# Patient Record
Sex: Male | Born: 1950 | Race: White | Hispanic: No | Marital: Married
Health system: Midwestern US, Community
[De-identification: ages and names within clinical notes are randomized; demographics above are authoritative.]

## PROBLEM LIST (undated history)

## (undated) DIAGNOSIS — R972 Elevated prostate specific antigen [PSA]: Secondary | ICD-10-CM

## (undated) DIAGNOSIS — Z8547 Personal history of malignant neoplasm of testis: Secondary | ICD-10-CM

---

## 2013-05-22 LAB — PSA, DIAGNOSTIC (PROSTATE SPECIFIC AG): PSA Total: 4.1

## 2013-05-26 NOTE — Telephone Encounter (Signed)
Called Mr.Steven to schedule him for a appointment with Dr.Brandon on Wednesday @ 10:30am however, he wasn't available. Left my name,number and extension to call our office back to schedule the appointment based on the office notes his (PCP) Dr. Dellia BeckwithPearman faxed over.

## 2013-06-05 LAB — AMB POC URINALYSIS DIP STICK AUTO W/O MICRO
Bilirubin (UA POC): NEGATIVE
Blood (UA POC): NEGATIVE
Glucose (UA POC): NEGATIVE
Ketones (UA POC): NEGATIVE
Leukocyte esterase (UA POC): NEGATIVE
Nitrites (UA POC): NEGATIVE
Protein (UA POC): NEGATIVE mg/dL
Specific gravity (UA POC): 1.02 (ref 1.001–1.035)
Urobilinogen (UA POC): 0.2 (ref 0.2–1)
pH (UA POC): 7.5 (ref 4.6–8.0)

## 2013-06-05 NOTE — Progress Notes (Signed)
ASSESSMENT:     ICD-9-CM    1. Chronic prostatitis 601.1 AMB POC URINALYSIS DIP STICK AUTO W/O MICRO   2. History of testicular cancer V10.47    3. Elevated PSA 790.93    4. 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 Elevated PSA- monitor/ recheck in 2-3 month  2. H/o gonadal mixed germ cell tumor- NED, needs CXR and AFP  3. ED- consider Cialis daily for BPH as DRE show mod enlargement  4. Hypogonadism- will refrain from testosterone replacement until full PSA evaluation       PLAN:  Schedule CXR, AFP, LDH, PSA in 2-3 months  Follow up after labs and CXR  Consider daily cialis vs testosterone replacement of PSA returned to baseline                 DISCUSSION: Elevate PSA discussion:    We reviewed the implications of an elevated PSA and the uncertainty surrounding it. In general, a man's PSA increases with age.   An elevated PSA can be a manifestation of one or more of the following items: prostate enlargement, prostate infection, or prostate cancer. Management of an elevated PSA can include observation or prostate biopsy and we discussed this in detail. All of his questions were answered to his level of satisfaction.       We reviewed the procedure for a prostate biopsy in detail.  I recommend rechecking his PSA in 3 months and if still elevated pursue biopsy.    Chief Complaint   Patient presents with   ??? Testicular Cancer   ??? Elevated PSA     4.7 on 05/19/13   ??? Erectile Dysfunction       HISTORY OF PRESENT ILLNESS:  Edward Beltran is a 63 y.o. male who presents today for consultation and has been referred by Dr. Alda LeaSTEVEN D PEARMAN, MD  .  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  He reports positive AFP prior to surgery.  He underwent RPLND without of evidence of reccurence since that surgery.     His psa rose from 1 to 2.8 in 2014 to most recent 4.7 with repeat value of 4.1 (3 days later).    No FH of prostate cancer.      He reports no voiding symptoms rare  1 x nocturia.    His testosterone was low 248    DRE normal today         Review of Systems  Constitutional: Fever: No;  Skin: Rash: No;  HEENT: Hearing difficulty: No;  Eyes: Blurred vision: No;  Cardiovascular: Chest pain: No;  Respiratory: Shortness of breath: No;  Gastrointestinal: Nausea/vomiting: No;  Musculoskeletal: Back pain: No;  Neurological: Weakness: No;  Psychological: Memory loss: No;  Comments/additional findings:                 Past Medical History   Diagnosis Date   ??? Kidney stones    ??? Testicular cancer (HCC)    ??? Essential tremor        Past Surgical History   Procedure Laterality Date   ??? Hx hernia repair  1982, 2013   ??? Hx urological  1985     Right testical removal   ??? Hx other surgical  1986     Lymphnode disection       History   Substance Use Topics   ??? Smoking status: Never Smoker    ???  Smokeless tobacco: Never Used   ??? Alcohol Use: Yes      Comment: OCC       No Known Allergies    Family History   Problem Relation Age of Onset   ??? Heart Disease Father    ??? Cancer Brother      Brain cancer/tumor   ??? Alzheimer Mother        Current Outpatient Prescriptions   Medication Sig Dispense Refill   ??? simvastatin (ZOCOR) 40 mg tablet        ??? propranolol LA (INDERAL LA) 120 mg SR capsule        ??? Aspirin, Buffered 81 mg tab Take  by mouth.             PHYSICAL EXAMINATION:   BP 130/78   Ht 6' (1.829 m)   Wt 210 lb (95.255 kg)   BMI 28.47 kg/m2  Constitutional: WDWN, Pleasant and appropriate affect, No acute distress.    CV:  No peripheral swelling noted  Respiratory: No respiratory distress or difficulties  Abdomen:  No abdominal masses or tenderness. No CVA tenderness. No inguinal hernias noted.   GU Male:    RUE:[id number]:Perineum normal to visual inspection, no erythema or irritation, Sphincter with good tone, Rectum with no hemorrhoids, fissures or masses, Prostate smooth, symmetric and anodular. Prostate is moderate.  SCROTUM:  No scrotal rash or lesions noticed.  Normal bilateral testes and epididymis.    PENIS: Urethral meatus normal in location and size. No urethral discharge.  Skin: No jaundice.    Neuro/Psych:  Alert and oriented x 3, affect appropriate.   Lymphatic:   No enlarged inguinal lymph nodes.        REVIEW OF LABS AND IMAGING:      Results for orders placed in visit on 06/05/13   AMB POC URINALYSIS DIP STICK AUTO W/O MICRO       Result Value Ref Range    Color (UA POC) Yellow      Clarity (UA POC) Clear      Glucose (UA POC) Negative  Negative    Bilirubin (UA POC) Negative  Negative    Ketones (UA POC) Negative  Negative    Specific gravity (UA POC) 1.020  1.001 - 1.035    Blood (UA POC) Negative  Negative    pH (UA POC) 7.5  4.6 - 8.0    Protein (UA POC) Negative  Negative mg/dL    Urobilinogen (UA POC) 0.2 mg/dL  0.2 - 1    Nitrites (UA POC) Negative  Negative    Leukocyte esterase (UA POC) Negative  Negative         Imaging Report Reviewed?  NO   Type:    Images Reviewed?      NO      Type:       Other Lab Data Reviewed?   YES    CBC, PSA and tests      I reviewed his CT scan from prior to his incisional hernia surgery.    A copy of today's office visit with all pertinent imaging results and labs were sent to the referring physician, Dr. Alda LeaSTEVEN D PEARMAN, MD         Francella SolianFrances M Buffey Zabinski, MD on 06/05/2013

## 2013-08-05 ENCOUNTER — Encounter

## 2013-08-17 LAB — PSA, DIAGNOSTIC (PROSTATE SPECIFIC AG): Prostate Specific Ag: 3.2 ng/mL (ref 0.0–4.0)

## 2013-08-17 LAB — AFP, TUMOR MARKER
AFP, SERUM, TUMOR MARKER, 002266: 1.1 ng/mL (ref 0.0–8.3)
AFP, Serum, Tumor Marker: 1.1 ng/mL (ref 0.0–8.3)

## 2013-08-17 LAB — LD: LD: 171 IU/L (ref 121–224)

## 2013-08-21 LAB — PROSTATE SPECIFIC ANTIGEN, TOTAL (PSA): PSA Total: 4.1

## 2013-08-24 LAB — AMB POC URINALYSIS DIP STICK AUTO W/O MICRO
Bilirubin (UA POC): NEGATIVE
Blood (UA POC): NEGATIVE
Glucose (UA POC): NEGATIVE
Ketones (UA POC): NEGATIVE
Leukocyte esterase (UA POC): NEGATIVE
Nitrites (UA POC): NEGATIVE
Specific gravity (UA POC): 1.02 (ref 1.001–1.035)
Urobilinogen (UA POC): 0.2 (ref 0.2–1)
pH (UA POC): 7 (ref 4.6–8.0)

## 2013-08-24 MED ORDER — TADALAFIL 5 MG TABLET
5 mg | ORAL_TABLET | Freq: Every day | ORAL | Status: DC
Start: 2013-08-24 — End: 2014-12-02

## 2013-08-24 NOTE — Progress Notes (Signed)
ASSESSMENT:    1. Elevated PSA- recheck PSA in 6 months  2. H/o gonadal mixed germ cell tumor- NED,   3.BPH- consider Cialis daily for BPH as DRE show mod enlargement  4. Hypogonadism- will refrain from testosterone replacement until full PSA evaluation  5. Kidney stones- increase hydration       PLAN:  CXR WNL  CT stone protocol reviewed with left distal ureteral calculus and multiple renal calcifications  Consider daily cialis vs testosterone replacement of PSA returned to baseline  PSA is 3.2 today.  Will repeat PSA in 6 months  Follow up in 6 months.  Consider 24hr urinalysis for stones.         DISCUSSION: Elevate PSA discussion:    We reviewed the implications of an elevated PSA and the uncertainty surrounding it. In general, a man's PSA increases with age.   An elevated PSA can be a manifestation of one or more of the following items: prostate enlargement, prostate infection, or prostate cancer. Management of an elevated PSA can include observation or prostate biopsy and we discussed this in detail. All of his questions were answered to his level of satisfaction.       We reviewed the procedure for a prostate biopsy in detail.  I recommend rechecking his PSA in 3 months and if still elevated pursue biopsy.    Chief Complaint   Patient presents with   ??? Prostatitis     Pt has no c/o.    ??? Testicular Cancer   ??? Elevated PSA       HISTORY OF PRESENT ILLNESS:  Edward Beltran is a 63 y.o. male who presents today for consultation and has been referred by Dr. Alda LeaSTEVEN D PEARMAN, MD (General)     .  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  He reports positive AFP prior to surgery.  He underwent RPLND without of evidence of reccurence since that surgery.  His tumor markers are negative today.  I reviewed his CT scan- althoug it was noncontrasted there was no evidence of adenopathy or recurrent in retroperitneum.     His psa rose from 1 to 2.8 in 2014 to most recent 4.7 with repeat value of 4.1 (3 days later).  PSA is pending.    No FH of prostate cancer.      He reports no voiding symptoms rare 1 x nocturia.    His testosterone was low 248    DRE normal today         Review of Systems  Constitutional: Fever: No;  Skin: Rash: No;  HEENT: Hearing difficulty: No;  Eyes: Blurred vision: No;  Cardiovascular: Chest pain: No;  Respiratory: Shortness of breath: No;  Gastrointestinal: Nausea/vomiting: No;  Musculoskeletal: Back pain: No;  Neurological: Weakness: No;  Psychological: Memory loss: No;  Comments/additional findings:                 Past Medical History   Diagnosis Date   ??? Kidney stones    ??? Testicular cancer (HCC)    ??? Essential tremor        Past Surgical History   Procedure Laterality Date   ??? Hx hernia repair  1982, 2013   ??? Hx urological  1985     Right testical removal   ??? Hx other surgical  1986     Lymphnode disection       History   Substance Use Topics   ??? Smoking status:  Never Smoker    ??? Smokeless tobacco: Never Used   ??? Alcohol Use: Yes      Comment: OCC       No Known Allergies    Family History   Problem Relation Age of Onset   ??? Heart Disease Father    ??? Cancer Brother      Brain cancer/tumor   ??? Alzheimer Mother        Current Outpatient Prescriptions   Medication Sig Dispense Refill   ??? tadalafil (CIALIS) 5 mg tablet Take 5 mg by mouth daily. For BPH 30 Tab 11   ??? simvastatin (ZOCOR) 40 mg tablet      ??? propranolol LA (INDERAL LA) 120 mg SR capsule      ??? Aspirin, Buffered 81 mg tab Take  by mouth.           PHYSICAL EXAMINATION:   BP 130/80 mmHg   Ht 6' (1.829 m)   Wt 210 lb (95.255 kg)   BMI 28.47 kg/m2  Constitutional: WDWN, Pleasant and appropriate affect, No acute distress.    CV:  No peripheral swelling noted  Respiratory: No respiratory distress or difficulties  Abdomen:  No abdominal masses or tenderness. No CVA tenderness. No inguinal hernias noted.    GU Male:  No inguinal hernia, normal phallus, DRE deferrred today- will need on next visit.  Skin: No jaundice.    Neuro/Psych:  Alert and oriented x 3, affect appropriate.   Lymphatic:   No enlarged inguinal lymph nodes.        REVIEW OF LABS AND IMAGING:      PSA 3.2    Results for orders placed or performed in visit on 08/24/13   AMB POC URINALYSIS DIP STICK AUTO W/O MICRO   Result Value Ref Range    Color (UA POC) Yellow     Clarity (UA POC) Clear     Glucose (UA POC) Negative Negative    Bilirubin (UA POC) Negative Negative    Ketones (UA POC) Negative Negative    Specific gravity (UA POC) 1.020 1.001 - 1.035    Blood (UA POC) Negative Negative    pH (UA POC) 7.0 4.6 - 8.0    Protein (UA POC) Trace Negative mg/dL    Urobilinogen (UA POC) 0.2 mg/dL 0.2 - 1    Nitrites (UA POC) Negative Negative    Leukocyte esterase (UA POC) Negative Negative         Imaging Report Reviewed? YES   Type:   CXR and CT scan  Images Reviewed?      Yes      Type:       Other Lab Data Reviewed?   YES    CBC, PSA, AFP and LDH, PSA WNL    A copy of today's office visit with all pertinent imaging results and labs were sent to the referring physician, Dr. Alda Lea, MD (General)         Francella Solian, MD on 08/24/2013

## 2013-10-08 NOTE — Telephone Encounter (Signed)
Called pt insurance @ [phone] and received a prior auth on pt's Cialis  Rx from [place], [place] # [id number]. Brittney S. informed pt via MyChart.

## 2014-02-19 ENCOUNTER — Encounter: Attending: Urology

## 2014-02-19 ENCOUNTER — Ambulatory Visit: Admit: 2014-02-19 | Discharge: 2014-02-19 | Payer: PRIVATE HEALTH INSURANCE | Attending: Urology

## 2014-02-19 DIAGNOSIS — R972 Elevated prostate specific antigen [PSA]: Secondary | ICD-10-CM

## 2014-02-19 LAB — AMB POC URINALYSIS DIP STICK AUTO W/O MICRO
Bilirubin (UA POC): NEGATIVE
Blood (UA POC): NEGATIVE
Glucose (UA POC): NEGATIVE
Ketones (UA POC): NEGATIVE
Leukocyte esterase (UA POC): NEGATIVE
Nitrites (UA POC): NEGATIVE
Protein (UA POC): NEGATIVE mg/dL
Specific gravity (UA POC): 1.02 (ref 1.001–1.035)
Urobilinogen (UA POC): 1 (ref 0.2–1)
pH (UA POC): 5.5 (ref 4.6–8.0)

## 2014-02-19 NOTE — Progress Notes (Signed)
ASSESSMENT:     ICD-10-CM ICD-9-CM    1. Elevated PSA R97.2 790.93 AMB POC URINALYSIS DIP STICK AUTO W/O MICRO   2. Kidney stone N20.0 592.0 AMB POC URINALYSIS DIP STICK AUTO W/O MICRO   3. Mixed germ cell tumor (HCC) C80.1 199.1      1. Patient is a 64 y.o. Caucasian male with stable PSA of 2.8ng/mL on 11/24/13.   2. H/o gonadal mixed germ cell tumor- NED  3. BPH- Cialis 5mg  daily for BPH   4. Hypogonadism- refrain from Testosterone replacement as patient does not feel he needs it right noe  5. Kidney stones- increase hydration      PLAN:    Patient is satisfied with his energy levels  Continue Cialis 5mg  daily for BPH  PSA 2.8ng/mL on 11/24/13  PSA, T rx given  F/u in 6 months with PSA and T prior      DISCUSSION: Elevate PSA discussion:    We reviewed the implications of an elevated PSA and the uncertainty surrounding it. In general, a man's PSA increases with age.   An elevated PSA can be a manifestation of one or more of the following items: prostate enlargement, prostate infection, or prostate cancer. Management of an elevated PSA can include observation or prostate biopsy and we discussed this in detail. All of his questions were answered to his level of satisfaction.       We reviewed the procedure for a prostate biopsy in detail.  I recommend rechecking his PSA in 3 months and if still elevated pursue biopsy.    Chief Complaint   Patient presents with   ??? Elevated PSA   ??? Kidney Stone   ??? prostatitis   ??? Testicular Cancer       HISTORY OF PRESENT ILLNESS:  Edward Beltran is a 64 y.o. Caucasian male who presents today in follow up for an elevated PSA.   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 Per patient,  positive AFP prior to surgery.  He underwent RPLND without of evidence of reccurence since that surgery. Tumor markers were negative at last visit.   Patient had a CT scan, althoug it was noncontrasted there was no evidence  of adenopathy or recurrent in retroperitneum.    Patient denies a FH of prostate cancer.    Patients PSA rose from 1ng/mL to 2.8ng/mL in 2014 to most recent 2.8ng/mL on 11/24/13.  Testosterone 246ng/dL on 4/69/624/10/15     Patient denies major urinary complaints.    Reports occasional nocturia x1-2.   He specifically denies gross hematuria, dysuria, starting/stopping stream, sense of PVR.     Patient is currently on Cialis 5mg  nightly. He reports he sometimes forgets to take his medications. He denies SEs.  Patient is taking baby ASA, Vitamins, Inderal and Simvastatin     Patient reports his overall energy level is fine. He denies fatigue.   Patient reports working out Monday - Thursday. He reports he does not sleep well when he does not work out.   Patient would like to hold off on testosterone replacement.   Patient does squat thrusts and he works out on the elliptical      PSA /TESTOSTERONE - BSHSI PSA   Latest Ref Rng 0.0 - 4.0 ng/mL   11/24/13 2.8   08/13/2013 3.2       Review of Systems  Constitutional: Fever: No  Skin: Rash: No  HEENT: Hearing difficulty: No  Eyes: Blurred  vision: No  Cardiovascular: Chest pain: No  Respiratory: Shortness of breath: No  Gastrointestinal: Nausea/vomiting: No  Musculoskeletal: Back pain: No  Neurological: Weakness: No  Psychological: Memory loss: No  Comments/additional findings:       Past Medical History   Diagnosis Date   ??? Kidney stones    ??? Testicular cancer (HCC)    ??? Essential tremor        Past Surgical History   Procedure Laterality Date   ??? Hx hernia repair  1982, 2013   ??? Hx urological  1985     Right testical removal   ??? Hx other surgical  1986     Lymphnode disection       History   Substance Use Topics   ??? Smoking status: Never Smoker    ??? Smokeless tobacco: Never Used   ??? Alcohol Use: 0.0 oz/week     0 Not specified per week      Comment: OCC       No Known Allergies    Family History   Problem Relation Age of Onset   ??? Heart Disease Father    ??? Cancer Brother       Brain cancer/tumor   ??? Alzheimer Mother        Current Outpatient Prescriptions   Medication Sig Dispense Refill   ??? tadalafil (CIALIS) 5 mg tablet Take 5 mg by mouth daily. For BPH 30 Tab 11   ??? simvastatin (ZOCOR) 40 mg tablet      ??? propranolol LA (INDERAL LA) 120 mg SR capsule      ??? Aspirin, Buffered 81 mg tab Take  by mouth.           PHYSICAL EXAMINATION:   BP 138/84 mmHg   Ht 6' (1.829 m)   Wt 210 lb (95.255 kg)   BMI 28.47 kg/m2  Constitutional: WDWN, Pleasant and appropriate affect, No acute distress.    CV:  No peripheral swelling noted, RRR  Respiratory: No respiratory distress or difficulties, CTA B  Abdomen:  No abdominal masses or tenderness. No CVA tenderness. No inguinal hernias noted.   GU Male:  DRE referred  Skin: No jaundice.    Neuro/Psych:  Alert and oriented x 3, affect appropriate.   Lymphatic:   No enlarged inguinal lymph nodes.        REVIEW OF LABS AND IMAGING:    Results for orders placed or performed in visit on 02/19/14   AMB POC URINALYSIS DIP STICK AUTO W/O MICRO   Result Value Ref Range    Color (UA POC)      Clarity (UA POC)      Glucose (UA POC) Negative Negative    Bilirubin (UA POC) Negative Negative    Ketones (UA POC) Negative Negative    Specific gravity (UA POC) 1.020 1.001 - 1.035    Blood (UA POC) Negative Negative    pH (UA POC) 5.5 4.6 - 8.0    Protein (UA POC) Negative Negative mg/dL    Urobilinogen (UA POC) 1 mg/dL 0.2 - 1    Nitrites (UA POC) Negative Negative    Leukocyte esterase (UA POC) Negative Negative       Imaging Report Reviewed? NO   Type:     Images Reviewed?      NO      Type:       Other Lab Data Reviewed?   YES    PSA, UA    A copy of today's  office visit with all pertinent imaging results and labs were sent to the referring physician, Dr. Alda Lea, MD         Francella Solian, MD on 02/19/2014        Medical documentation is provided with the assistance of Nikki L. Bing Plume, medical scribe for Francella Solian, MD on 02/19/2014.

## 2014-08-23 ENCOUNTER — Encounter: Attending: Urology

## 2014-08-25 ENCOUNTER — Encounter

## 2014-08-30 ENCOUNTER — Encounter: Attending: Urology

## 2014-08-30 ENCOUNTER — Ambulatory Visit: Admit: 2014-08-30 | Discharge: 2014-08-30 | Payer: PRIVATE HEALTH INSURANCE | Attending: Urology

## 2014-08-30 DIAGNOSIS — R972 Elevated prostate specific antigen [PSA]: Secondary | ICD-10-CM

## 2014-08-30 LAB — AMB POC URINALYSIS DIP STICK AUTO W/O MICRO
Bilirubin (UA POC): NEGATIVE
Blood (UA POC): NEGATIVE
Glucose (UA POC): NEGATIVE
Ketones (UA POC): NEGATIVE
Leukocyte esterase (UA POC): NEGATIVE
Nitrites (UA POC): NEGATIVE
Protein (UA POC): NEGATIVE mg/dL
Specific gravity (UA POC): 1.025 (ref 1.001–1.035)
Urobilinogen (UA POC): 1 (ref 0.2–1)
pH (UA POC): 5.5 (ref 4.6–8.0)

## 2014-08-30 NOTE — Progress Notes (Signed)
ASSESSMENT:     ICD-10-CM ICD-9-CM    1. Elevated PSA R97.2 790.93 PR COLLECTION VENOUS BLOOD,VENIPUNCTURE      PROSTATE SPECIFIC AG (PSA)      AMB POC URINALYSIS DIP STICK AUTO W/O MICRO   2. 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      AMB POC URINALYSIS DIP STICK AUTO W/O MICRO   3. Erectile dysfunction, unspecified erectile dysfunction type N52.9 607.84 TESTOSTERONE, TOTAL, ADULT MALE      PR COLLECTION VENOUS BLOOD,VENIPUNCTURE      AMB POC URINALYSIS DIP STICK AUTO W/O MICRO     1. Patient is a 64 y.o. Caucasian male with stable PSA of 2.8ng/mL on 11/24/13.   2. H/o gonadal mixed germ cell tumor- NED  3. BPH- Cialis 5mg  daily for BPH   4. Hypogonadism- refrain from Testosterone replacement as patient does not feel he needs it right now  5. Kidney stones- increase hydration      PLAN:    Labs reviewed  Patient is satisfied with his energy levels  Continue Cialis 5mg  daily for BPH   PSA 2.8ng/mL on 11/24/13, today's drawn   F/u in 1 year with psa, tumor markers and T unless today's require earlier       DISCUSSION: Elevate PSA discussion:    We reviewed the implications of an elevated PSA and the uncertainty surrounding it. In general, a man's PSA increases with age.   An elevated PSA can be a manifestation of one or more of the following items: prostate enlargement, prostate infection, or prostate cancer. Management of an elevated PSA can include observation or prostate biopsy and we discussed this in detail. All of his questions were answered to his level of satisfaction.       We reviewed the procedure for a prostate biopsy in detail.  I recommend rechecking his PSA in 3 months and if still elevated pursue biopsy.    Chief Complaint   Patient presents with   ??? Testicular Cancer       HISTORY OF PRESENT ILLNESS:  Edward Beltran is a 64 y.o. Caucasian male who presents today in follow up for an elevated PSA.    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 Per patient,  positive AFP prior to surgery.  He underwent RPLND without of evidence of reccurence since that surgery. Tumor markers were negative at last visit.   Patient had a CT scan, although it was noncontrasted there was no evidence of adenopathy or recurrent in retroperitneum.    Patient denies a FH of prostate cancer.    Patients PSA rose from 1ng/mL to 2.8ng/mL in 2014 to most recent 2.8ng/mL on 11/24/2013  Testosterone 246ng/dL on 5/62/134/10/15, today's drawn in office     Patient denies major urinary complaints.    Reports occasional nocturia x1-2.   He specifically denies gross hematuria, dysuria, starting/stopping stream, sense of PVR.     Patient is currently on Cialis 5mg  nightly. He reports he sometimes forgets to take his medications. He denies SEs.  Patient is taking baby ASA, Vitamins, Inderal and Simvastatin     Patient reports his overall energy level is fine. He denies fatigue.   Patient reports working out Monday - Thursday. He reports he does not sleep well when he does not work out.  Patient would like to hold off on testosterone replacement.   Patient does squat thrusts and he works out on the elliptical      PSA /TESTOSTERONE - BSHSI PSA   Latest Ref Rng 0.0 - 4.0 ng/mL   08/30/2014 pending   11/24/13 2.8   08/13/2013 3.2       Review of Systems  Constitutional: Fever: No  Skin: Rash: No  HEENT: Hearing difficulty: No  Eyes: Blurred vision: No  Cardiovascular: Chest pain: No  Respiratory: Shortness of breath: No  Gastrointestinal: Nausea/vomiting: No  Musculoskeletal: Back pain: No  Neurological: Weakness: No  Psychological: Memory loss: No  Comments/additional findings:       Past Medical History   Diagnosis Date   ??? Kidney stones    ??? Testicular cancer (HCC)    ??? Essential tremor        Past Surgical History   Procedure Laterality Date   ??? Hx hernia repair  1982, 2013   ??? Hx urological  1985      Right testical removal   ??? Hx other surgical  1986     Lymphnode disection       History   Substance Use Topics   ??? Smoking status: Never Smoker    ??? Smokeless tobacco: Never Used   ??? Alcohol Use: 0.0 oz/week     0 Standard drinks or equivalent per week      Comment: OCC       No Known Allergies    Family History   Problem Relation Age of Onset   ??? Heart Disease Father    ??? Cancer Brother      Brain cancer/tumor   ??? Alzheimer Mother        Current Outpatient Prescriptions   Medication Sig Dispense Refill   ??? tadalafil (CIALIS) 5 mg tablet Take 5 mg by mouth daily. For BPH 30 Tab 11   ??? simvastatin (ZOCOR) 40 mg tablet      ??? propranolol LA (INDERAL LA) 120 mg SR capsule      ??? Aspirin, Buffered 81 mg tab Take  by mouth.           PHYSICAL EXAMINATION:   BP 142/82 mmHg   Ht 6' (1.829 m)   Wt 210 lb (95.255 kg)   BMI 28.47 kg/m2  Constitutional: WDWN, Pleasant and appropriate affect, No acute distress.    CV:  No peripheral swelling noted  Respiratory: No respiratory distress or difficulties,  Abdomen:  No abdominal masses or tenderness. No CVA tenderness.   GU Male:  DRE referred  Skin: No jaundice.    Neuro/Psych:  Alert and oriented x 3, affect appropriate.   Lymphatic:   No enlarged inguinal lymph nodes.        REVIEW OF LABS AND IMAGING:    Results for orders placed or performed in visit on 08/30/14   TESTOSTERONE, TOTAL, ADULT MALE   Result Value Ref Range    Testosterone 169 (L) 348 - 1197 ng/dL    COMMENT Comment    AFP, TUMOR MARKER   Result Value Ref Range    AFP, Serum, Tumor Marker 1.5 0.0 - 8.3 ng/mL   LD   Result Value Ref Range    LD 165 121 - 224 IU/L   TOTAL HCG, QT.   Result Value Ref Range    HCG, beta, QT <1 0 - 3 mIU/mL   PROSTATE SPECIFIC AG (PSA)   Result Value Ref Range  Prostate Specific Ag 3.6 0.0 - 4.0 ng/mL   AMB POC URINALYSIS DIP STICK AUTO W/O MICRO   Result Value Ref Range    Color (UA POC) Yellow     Clarity (UA POC) Clear     Glucose (UA POC) Negative Negative     Bilirubin (UA POC) Negative Negative    Ketones (UA POC) Negative Negative    Specific gravity (UA POC) 1.025 1.001 - 1.035    Blood (UA POC) Negative Negative    pH (UA POC) 5.5 4.6 - 8.0    Protein (UA POC) Negative Negative mg/dL    Urobilinogen (UA POC) 1 mg/dL 0.2 - 1    Nitrites (UA POC) Negative Negative    Leukocyte esterase (UA POC) Negative Negative       Imaging Report Reviewed? NO   Type:     Images Reviewed?      NO      Type:       Other Lab Data Reviewed?   YES   UA, cmp,     A copy of today's office visit with all pertinent imaging results and labs were sent to the referring physician, Dr. Alda Lea, MD       Marvis Repress, PA on 08/30/2014      I saw and evaluated the patient, performing the key elements of the service.  I discussed the findings, assessment and plan with PA. Groshel and agree with her findings and plan as documented in the note.     Brandy Hale. Daphine Deutscher, M.D., [facility]   Urological Oncologist   [facility]   [address]   [place], [place] [address]   [phone]

## 2014-08-31 LAB — LD: LD: 165 IU/L (ref 121–224)

## 2014-08-31 LAB — TESTOSTERONE, TOTAL, ADULT MALE: Testosterone: 169 ng/dL — ABNORMAL LOW (ref 348–1197)

## 2014-08-31 LAB — PSA, DIAGNOSTIC (PROSTATE SPECIFIC AG): Prostate Specific Ag: 3.6 ng/mL (ref 0.0–4.0)

## 2014-08-31 LAB — BETA HCG, QT
HCG, BETA, HCGTLT: <1 m[IU]/mL (ref 0–3)
HCG, beta, QT: <1 m[IU]/mL (ref 0–3)

## 2014-08-31 LAB — AFP, TUMOR MARKER
AFP, SERUM, TUMOR MARKER, 002266: 1.5 ng/mL (ref 0.0–8.3)
AFP, Serum, Tumor Marker: 1.5 ng/mL (ref 0.0–8.3)

## 2014-11-02 NOTE — Progress Notes (Signed)
Prior auth for cialis  submitted to insurance via cover my meds. Pending response from insurance

## 2014-12-07 MED ORDER — CIALIS 5 MG TABLET
5 mg | ORAL_TABLET | ORAL | 0 refills | Status: AC
Start: 2014-12-07 — End: ?

## 2014-12-07 NOTE — Telephone Encounter (Signed)
approve

## 2014-12-21 NOTE — Telephone Encounter (Signed)
Pt has moved out of state so we cancelled his apt.//SDusza

## 2014-12-24 ENCOUNTER — Encounter

## 2014-12-24 ENCOUNTER — Encounter: Attending: Urology

## 2015-08-22 ENCOUNTER — Encounter

## 2015-08-29 ENCOUNTER — Encounter: Attending: Urology

## 2018-09-26 IMAGING — CT CT ABDOMEN AND PELVIS WITHOUT CONTRAST
2 of 4 series · 14 of 46 positions shown, 16 images · non-contrast
Comparison: Comparison was made to the prior exam(s) within the last 12 months 
dated  renal ultrasound with postvoid imaging 09/12/2018

CT ABDOMEN AND PELVIS WITHOUT CONTRAST, 09/26/2018 [DATE]: 
CLINICAL INDICATION:  Nephrolithiasis 
A search for DICOM formatted images was conducted for prior CT imaging studies 
completed at a non-affiliated media free facility.
TECHNIQUE: The abdomen and pelvis were scanned from lung bases through the 
pubic rami without contrast on a high-resolution CT scanner using dose reduction 
techniques..  Routine MPR reconstructions were performed.

[Series 2: abd/pel ax wo · axial · 0.96mm/px · z∈[-569,-44]mm · 11 of 203 slices shown, 13 images]
[im 14/203  soft-tissue]
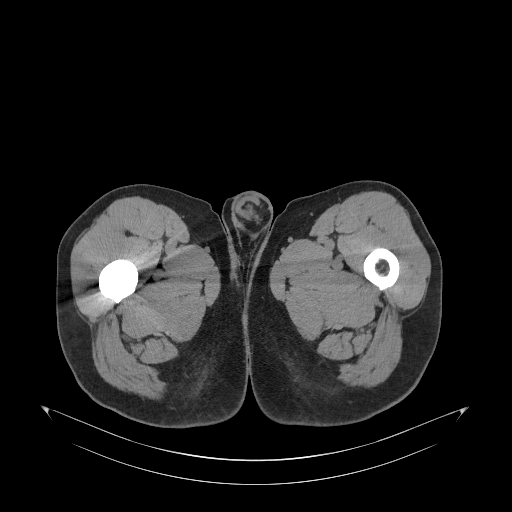
[im 14/203  bone]
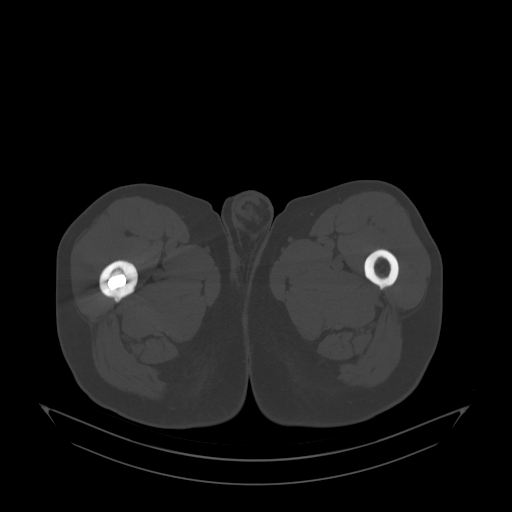
[im 27/203  soft-tissue]
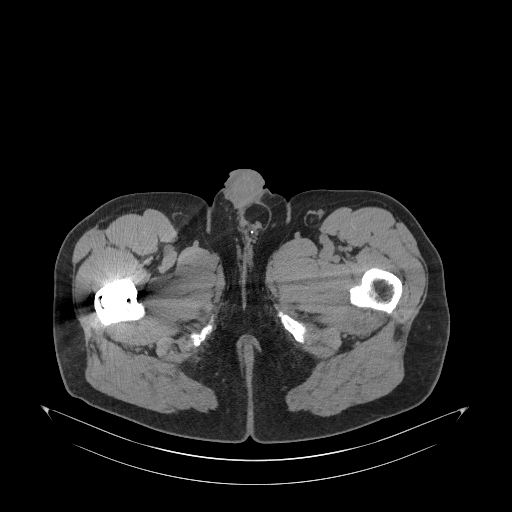
[im 54/203  soft-tissue]
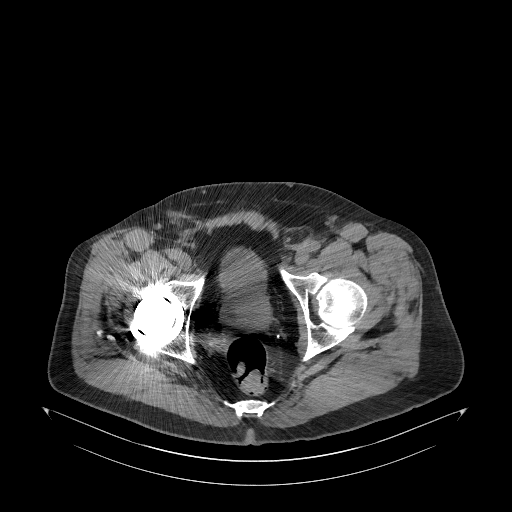
[im 68/203  soft-tissue]
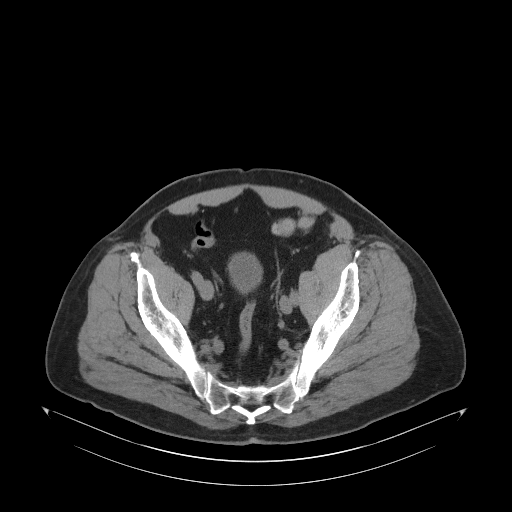
[im 81/203  soft-tissue]
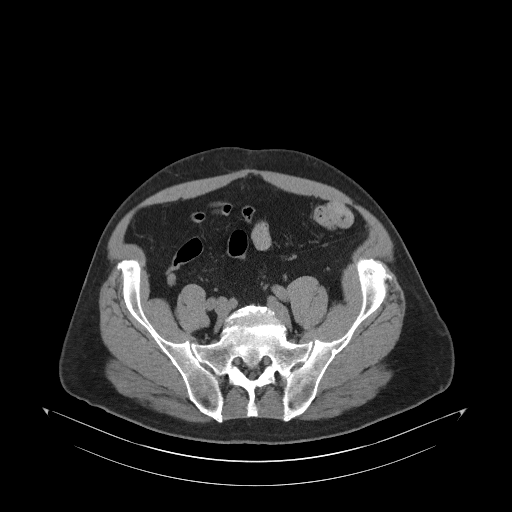
[im 108/203  soft-tissue]
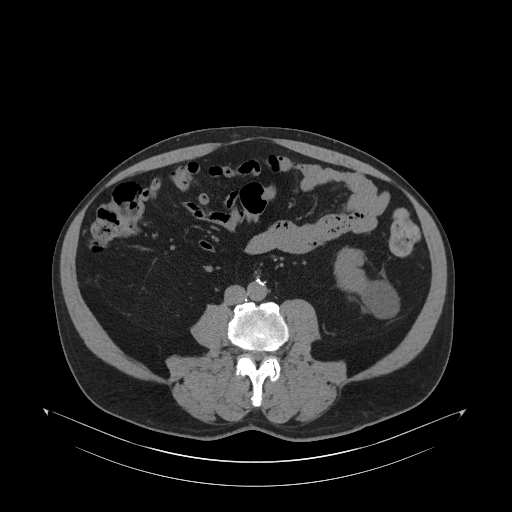
[im 122/203  soft-tissue]
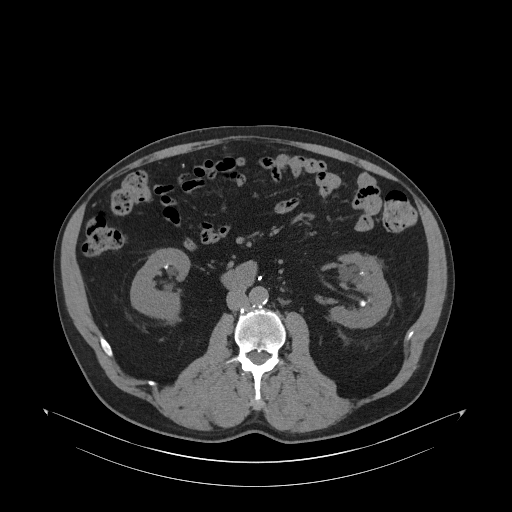
[im 135/203  soft-tissue]
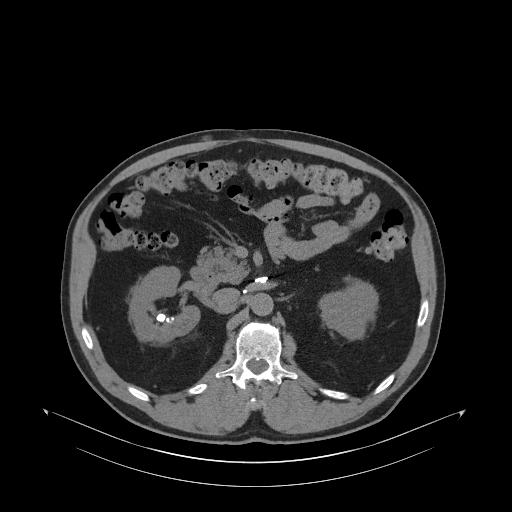
[im 149/203  soft-tissue]
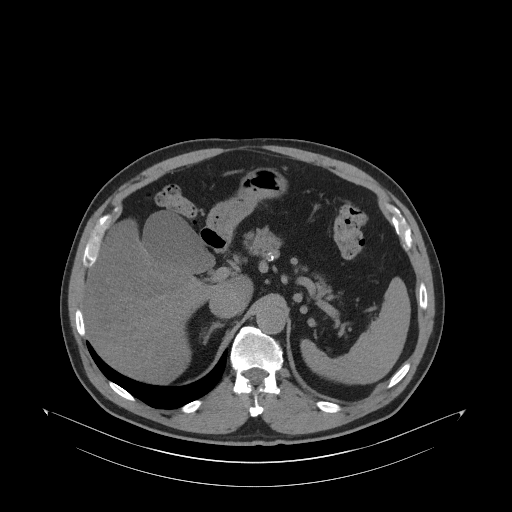
[im 149/203  bone]
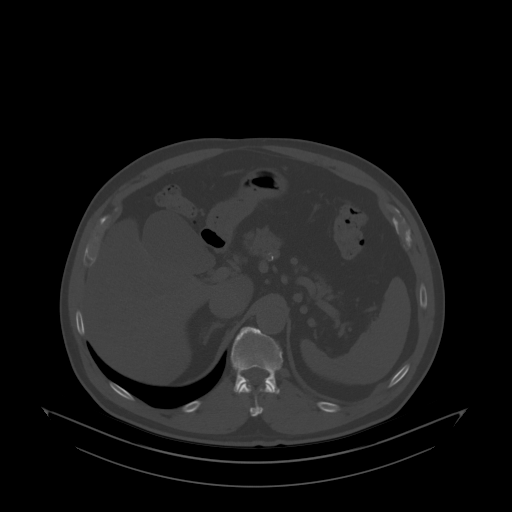
[im 176/203  soft-tissue]
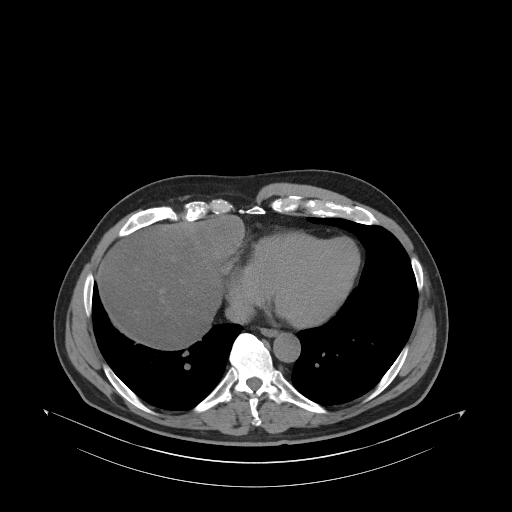
[im 189/203  soft-tissue]
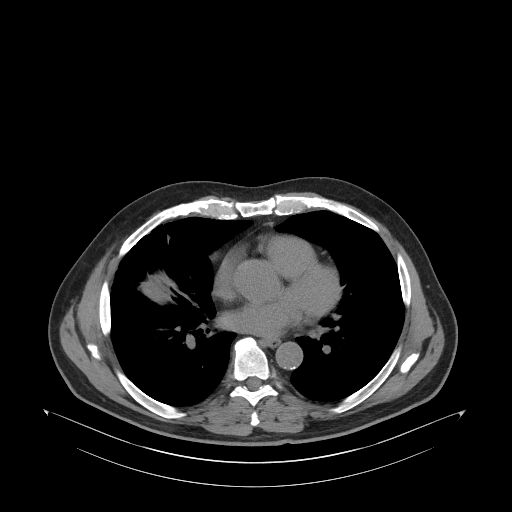

[Series 4: abd/(person_name) · coronal · 0.90mm/px · 3 of 164 slices shown]
[im 55/164  soft-tissue]
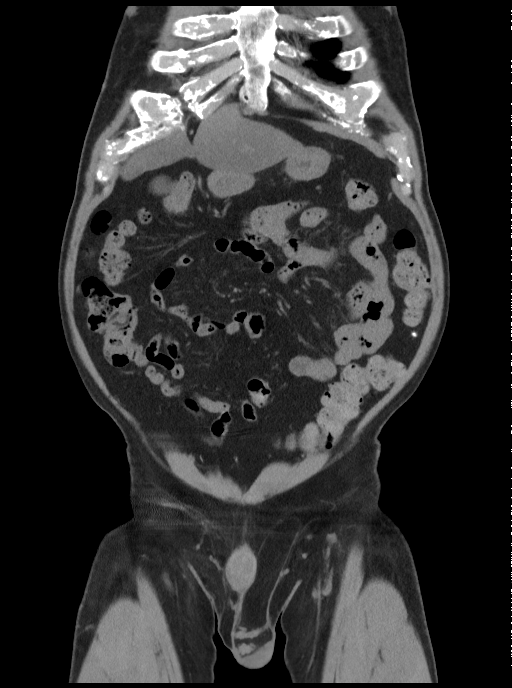
[im 73/164  soft-tissue]
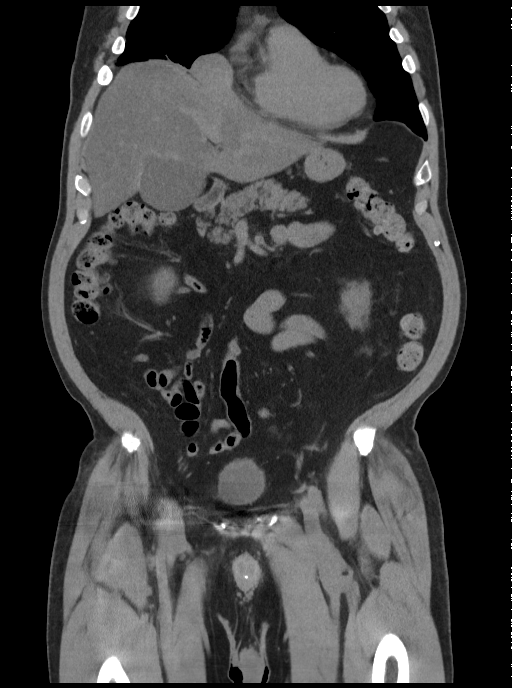
[im 91/164  soft-tissue]
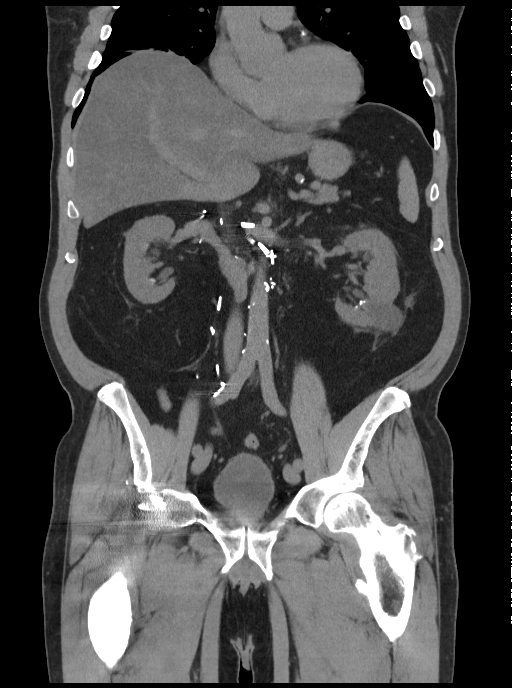

[14 of 46 positions shown; findings below may reference images not displayed]

FINDINGS: CHEST: There is elevation of the anterior aspect of the right hemidiaphragm mild 
adjacent compressive atelectasis of the right middle lobe. Lung bases are 
otherwise clear. No focal consolidation. Cardiac size is generous. No 
pericardial effusion. Scattered coronary artery calcifications. 
ABDOMEN: Diffusely decreased density of the liver is consistent with fatty 
change. There is a 2.2 cm simple cyst in the subcapsular region of the liver 
adjacent to the gallbladder. The gallbladder, pancreas, spleen, and adrenal 
glands are normal. 
Numerous nonobstructing right renal stones are identified in the superior, mid, 
and inferior pole calyces. The largest stone is in a superior pole calyx, 
measuring 0.8 cm in maximum dimensions. No right hydronephrosis. 
Numerous nonobstructing left renal stones are identified in the superior, mid, 
and inferior pole calyces. The largest stone is located in an inferior pole 
calyx, measuring 0.7 cm maximum dimensions. There are a few small parapelvic 
cysts of the left kidney and there is a simple cortical cyst projecting off the 
inferior pole which measures 4 cm diameter. There is no left hydronephrosis. 
The right and left ureters are normal in course and caliber. No hydroureter or 
ureteral stone identified. 
Bowel loops are normal in caliber. There are no bowel inflammatory changes. No 
free intraperitoneal fluid or gas. No retroperitoneal or mesenteric 
lymphadenopathy. There are numerous surgical clips adjacent to the aorta and 
inferior vena cava, and the right common and external iliac arteries. The 
abdominal aorta is normal in caliber. There are moderate atherosclerotic 
calcifications of the aorta. 
PELVIS: The urinary bladder is normal. The prostate gland is moderately 
enlarged, measuring 5.4 x 5.2 x 4.3 cm, with mass effect on the base of the 
urinary bladder. The seminal vesicles are diffusely thickened, measuring up to 
2.3 cm in thickness, and up to 4.0 cm in length. There is no definite fat plane 
between the enlarged seminal vesicles and in the enlarged prostate gland. The 
appearance is nonspecific, but could suggest infiltration of the seminal 
vesicles by prostatic neoplasm. 
There is no iliac chain or inguinal lymphadenopathy identified. There is a small 
fat-containing left inguinal hernia. 
MUSCULOSKELETAL: Spondylotic changes of the lumbar spine are identified, 
greatest at L4-5 and L5-S1. There is a tiny benign bone island within the right 
anterior aspect of the L4 vertebral body. Right total hip prosthesis is in 
place.
IMPRESSION: 1.  Multifocal nonobstructing bilateral nephrolithiasis, measuring up to 0.8 cm 
on the right, and 0.7 cm on the left. 
2.  Moderate enlargement of the prostate gland with mass effect on the base of 
the urinary bladder. 
3.  Diffuse enlargement of the seminal vesicles, with lack of fat plane between 
the seminal vesicles and the prostate gland. The appearance on this exam is 
nonspecific, but can suggest infiltration of the seminal vesicles by prostatic 
neoplasm. Additionally, an inflammatory process of the seminal vesicles (seminal 
vesiculitis) could have a similar appearance. Recommend correlation with PSA 
value, an additional imaging with dedicated prostate MRI. 
RADIATION DOSE REDUCTION: All CT scans are performed using radiation dose 
reduction techniques, when applicable.  Technical factors are evaluated and 
adjusted to ensure appropriate moderation of exposure.  Automated dose 
management technology is applied to adjust the radiation doses to minimize 
exposure while achieving diagnostic quality images.

## 2018-10-30 IMAGING — MR MULTI PARAMETRIC MRI PROSTATE WITHOUT AND WITH CONTRAST
13 series · 48 of 48 positions shown · IV contrast (gadavist)
Comparison: none

MULTI PARAMETRIC MRI PROSTATE WITHOUT AND WITH CONTRAST, 10/30/2018 [DATE]: 
CLINICAL INDICATION: Elevated PSA 
The patient's eGFR was calculated to be 70.8 using the i-STAT device.
TECHNIQUE: Multiple parametric sequences were performed. 
Pre-contrast: T1 axial of the entire pelvis. T2 sagittal, axial and coronal, T1 
axial, acquired of the prostate. Diffusion with multiple B values of 5666, 0988 
calculated ADC value for mapping. 
Post contrast: Rapid sequence dynamic and axial planes through the prostate and 
seminal vesicles, T1 axial with fat sat of the entire pelvis. 3-D renderings 
were reconstructed on an independent workstation. The images were also evaluated 
with Dyna CAD computer aided detection.  10 cc of Gadavist was injected 
intravenously with the injector at a rate of 2.5 cc per second. As per [HOSPITAL] guidelines 3-D reconstructions are performed with 
concurrent physician supervision.

[Series 101: survey · axial · 10.0mm · 1.39mm/px · 1 of 11 slices shown]
[im 1/11]
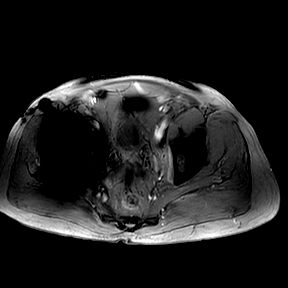

[Series 201: t1w_tse_ax · axial · 6.0mm · 0.42mm/px · 1 of 38 slices shown]
[im 1/38]
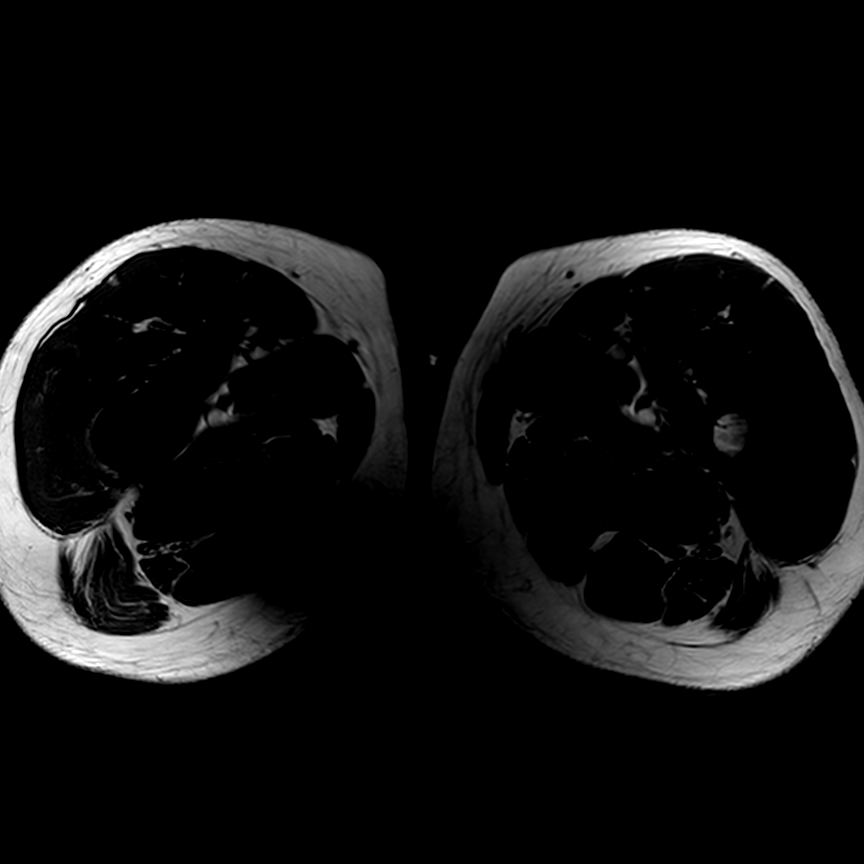

[Series 301: t2w sag · sagittal · 3.0mm · 0.45mm/px · 1 of 32 slices shown]
[im 1/32]
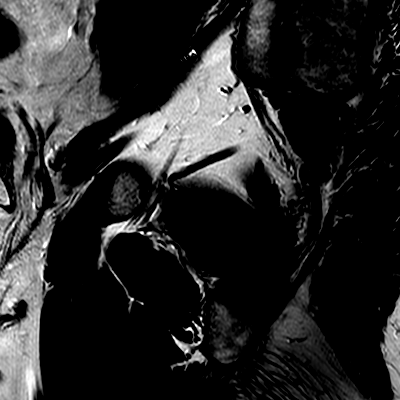

[Series 401: t2w cor · coronal · 3.0mm · 0.42mm/px · 1 of 30 slices shown]
[im 1/30]
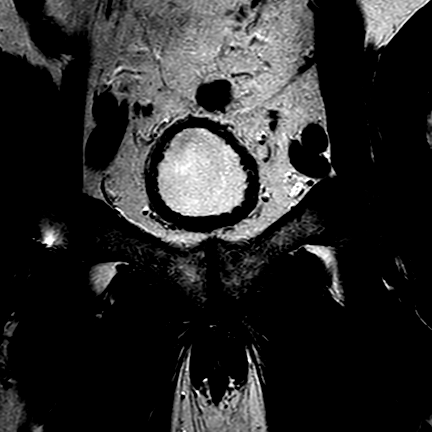

[Series 501: t2w ax · axial · 3.0mm · 0.27mm/px · 1 of 32 slices shown]
[im 1/32]
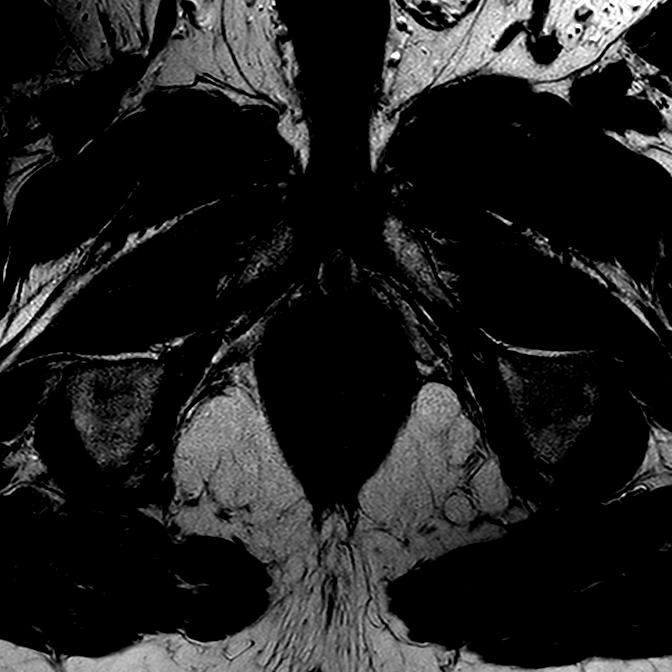

[Series 601: new-dwi_3b* 3mm* · axial · 3.0mm · 1.28mm/px · 1 of 62 slices shown]
[im 1/62]
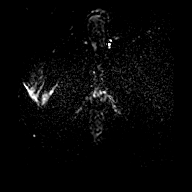

[Series 602: ADC · axial · 3.0mm · 1.28mm/px · 1 of 32 slices shown (1 of 2)]
[im 1/32]
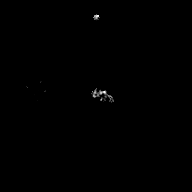

[Series 603: ADC · axial · 3.0mm · 1.28mm/px · 1 of 32 slices shown (2 of 2)]
[im 1/32]
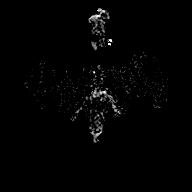

[Series 604: (id) · axial · 3.0mm · 1.28mm/px · 1 of 31 slices shown (1 of 3)]
[im 1/31]
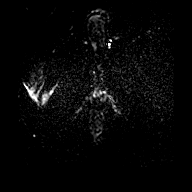

[Series 605: (id) · axial · 3.0mm · 1.28mm/px · 1 of 31 slices shown (2 of 3)]
[im 1/31]
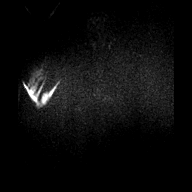

[Series 701: (id) · axial · 3.0mm · 1.28mm/px · 1 of 32 slices shown (3 of 3)]
[im 1/32]
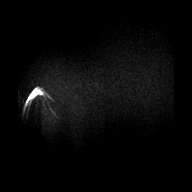

[Series 901: dyn 3mm*(ap) · axial · 3.0mm · 1.38mm/px · z∈[-85,+8]mm · 36 of 1440 slices shown]
[im 1/1440]
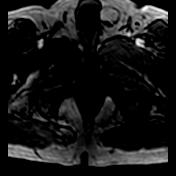
[im 42/1440]
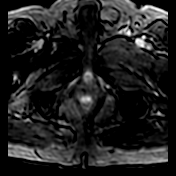
[im 83/1440]
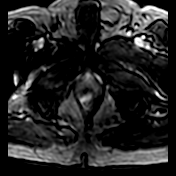
[im 124/1440]
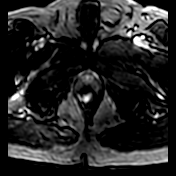
[im 165/1440]
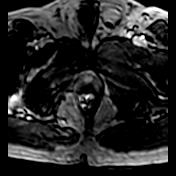
[im 206/1440]
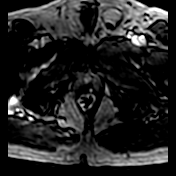
[im 247/1440]
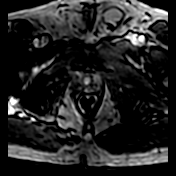
[im 288/1440]
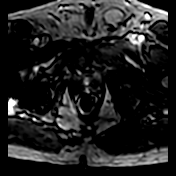
[im 329/1440]
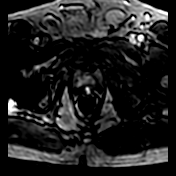
[im 371/1440]
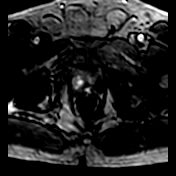
[im 412/1440]
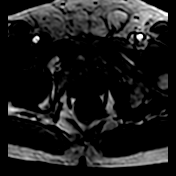
[im 453/1440]
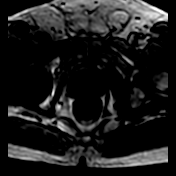
[im 494/1440]
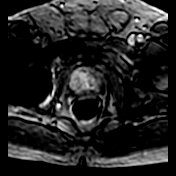
[im 535/1440]
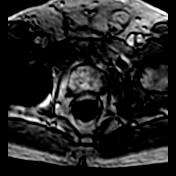
[im 576/1440]
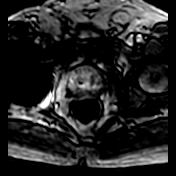
[im 617/1440]
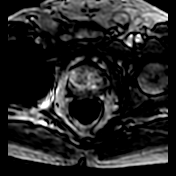
[im 658/1440]
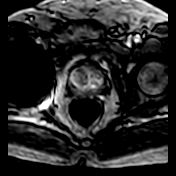
[im 699/1440]
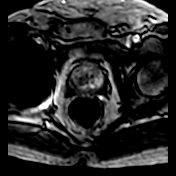
[im 741/1440]
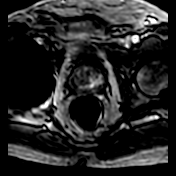
[im 782/1440]
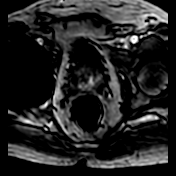
[im 823/1440]
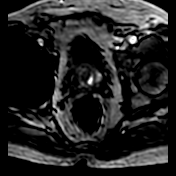
[im 864/1440]
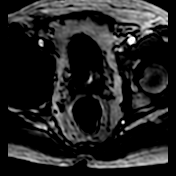
[im 905/1440]
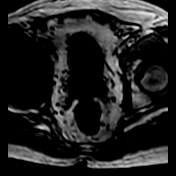
[im 946/1440]
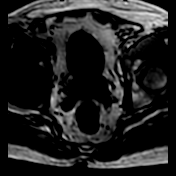
[im 987/1440]
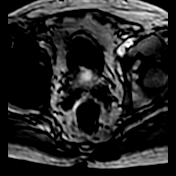
[im 1028/1440]
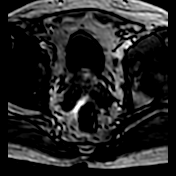
[im 1069/1440]
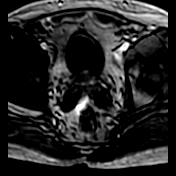
[im 1111/1440]
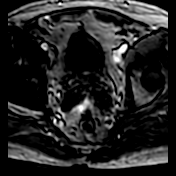
[im 1152/1440]
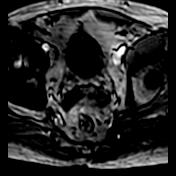
[im 1193/1440]
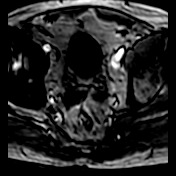
[im 1234/1440]
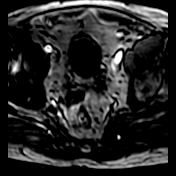
[im 1275/1440]
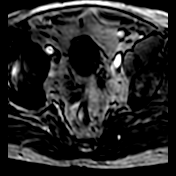
[im 1316/1440]
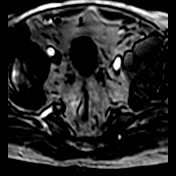
[im 1357/1440]
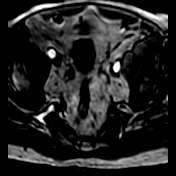
[im 1398/1440]
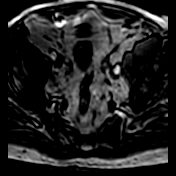
[im 1440/1440]
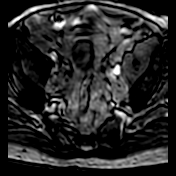

[Series 1001: t1w/sp/pel+g · axial · 6.0mm · 0.56mm/px · 1 of 38 slices shown]
[im 1/38]
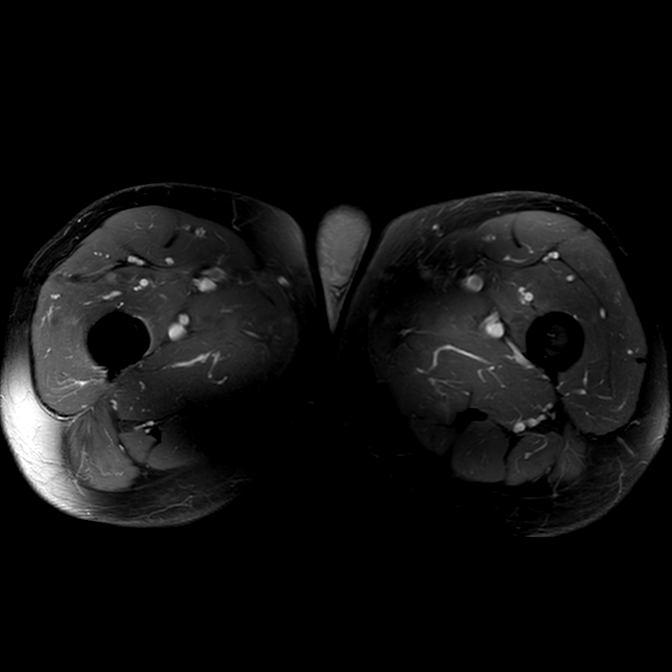

[48 of 48 positions shown; findings below may reference images not displayed]

FINDINGS: VOLUME: 70 cc 
CENTRAL GLAND: Heterogeneous, nodular and enlarged. Mildly herniates through the 
bladder neck. No suspicious mass seen. 
PERIPHERAL ZONE: There is a focus of decreased T2 signal within the left 
peripheral zone mid gland level measuring no greater than 5 mm in diameter. 
However, does have ADC and diffusion correlates suspicious for focal malignancy. 
Does not abut the capsule. 
SEMINAL VESICLES: Normal in appearance. 
BLADDER: Trabeculated 
LYMPHADENOPATHY: None 
BONES: No osseous lesions identified. 
OTHER: Right hip prosthesis.
IMPRESSION: (PI-RADS 4): High (clinically significant cancer is likely to be present).

## 2020-05-23 IMAGING — MR MULTI PARAMETRIC MRI PROSTATE WITHOUT AND WITH CONTRAST
13 series · 48 of 48 positions shown · IV contrast (agent unspecified)
Comparison: Prostate MRI October 30, 2018.

MULTI PARAMETRIC MRI PROSTATE WITHOUT AND WITH CONTRAST, 05/23/2020 [DATE]: 
CLINICAL INDICATION:  Elevated PSA most recently 5.4. Prior positive prostate 
biopsy 1919.
TECHNIQUE: Multiple parametric sequences were performed. 
Pre-contrast: T1 axial of the entire pelvis. T2 sagittal, axial and coronal, T1 
axial, acquired of the prostate. Diffusion with multiple B values of 1666, 9022 
calculated ADC value for mapping. 
Post contrast: Rapid sequence dynamic and axial planes through the prostate and 
seminal vesicles, T1 axial with fat sat of the entire pelvis. 3-D renderings 
were reconstructed on an independent workstation. The images were also evaluated 
with Dyna CAD computer aided detection.  A 0.5 cc Gadovist injected 
intravenously at 2 cc/s.  As per [HOSPITAL] guidelines 3-D 
reconstructions are performed with concurrent physician supervision. The 
patient's eGFR was calculated to be 83.4 using the i-STAT device.

[Series 101: survey-include kidney(hilum) · axial · 10.0mm · 1.34mm/px · 1 of 14 slices shown]
[im 1/14]
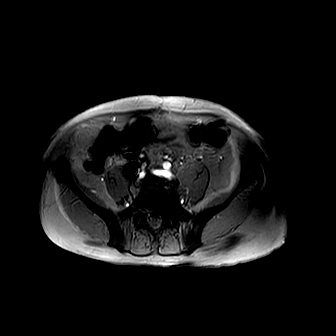

[Series 201: T1 · axial · 6.0mm · 0.68mm/px · 1 of 36 slices shown]
[im 1/36]
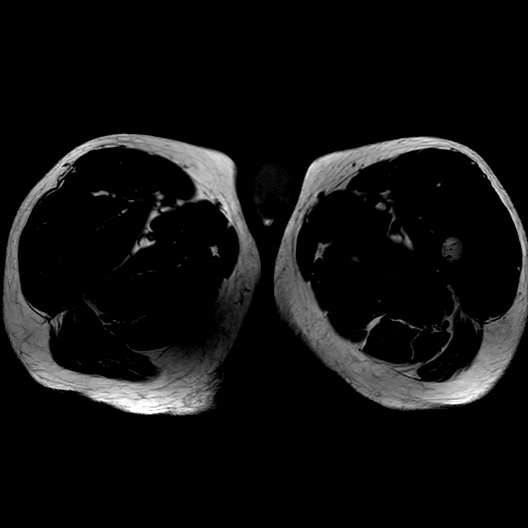

[Series 301: t2w sag · sagittal · 3.0mm · 0.45mm/px · 1 of 30 slices shown]
[im 1/30]
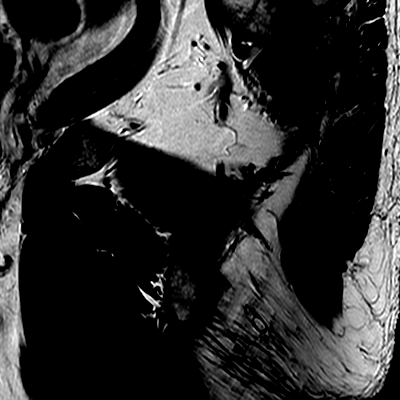

[Series 401: t2w cor · coronal · 3.0mm · 0.42mm/px · 1 of 32 slices shown]
[im 1/32]
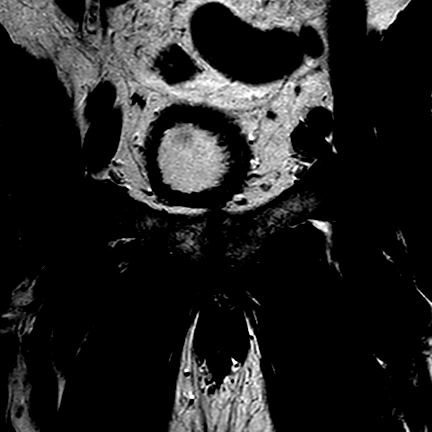

[Series 501: t2w ax · axial · 3.0mm · 0.27mm/px · 1 of 32 slices shown]
[im 1/32]
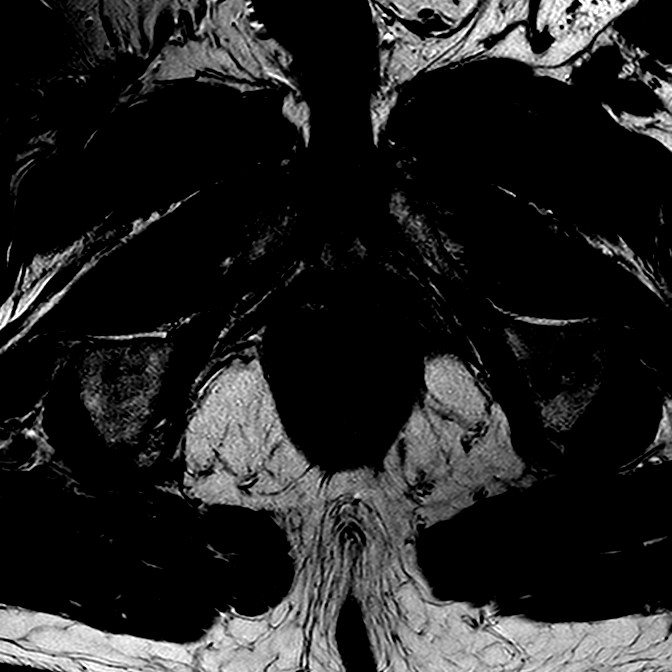

[Series 601: new-dwi_3b* 3mm* · axial · 3.0mm · 1.28mm/px · 1 of 63 slices shown]
[im 1/63]
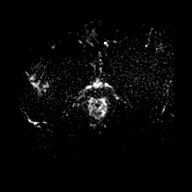

[Series 602: ADC · axial · 3.0mm · 1.28mm/px · 1 of 32 slices shown (1 of 2)]
[im 1/32]
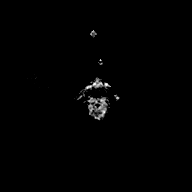

[Series 603: ADC · axial · 3.0mm · 1.28mm/px · 1 of 32 slices shown (2 of 2)]
[im 1/32]
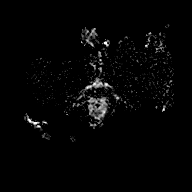

[Series 604: (id) · axial · 3.0mm · 1.28mm/px · 1 of 31 slices shown (1 of 3)]
[im 1/31]
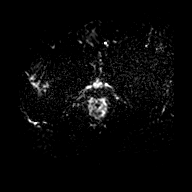

[Series 605: (id) · axial · 3.0mm · 1.28mm/px · 1 of 32 slices shown (2 of 3)]
[im 1/32]
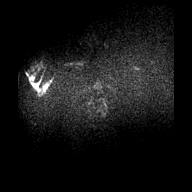

[Series 701: (id) · axial · 3.0mm · 1.28mm/px · 1 of 32 slices shown (3 of 3)]
[im 1/32]
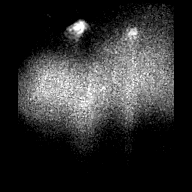

[Series 901: dyn 3mm*(ap) · axial · 3.0mm · 1.38mm/px · z∈[-172,-79]mm · 36 of 1440 slices shown]
[im 1/1440]
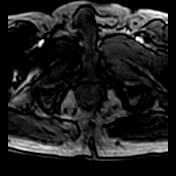
[im 42/1440]
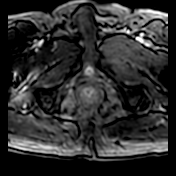
[im 83/1440]
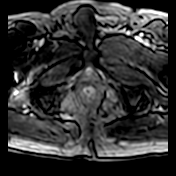
[im 124/1440]
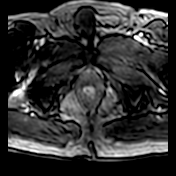
[im 165/1440]
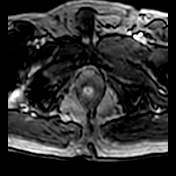
[im 206/1440]
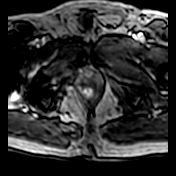
[im 247/1440]
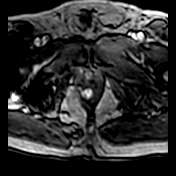
[im 288/1440]
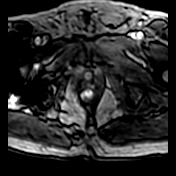
[im 329/1440]
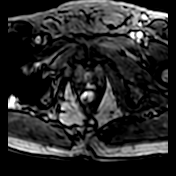
[im 371/1440]
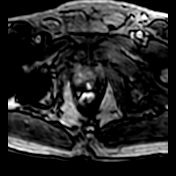
[im 412/1440]
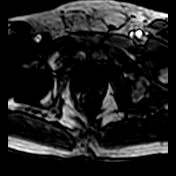
[im 453/1440]
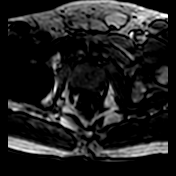
[im 494/1440]
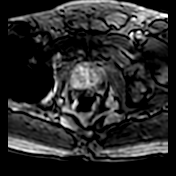
[im 535/1440]
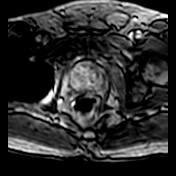
[im 576/1440]
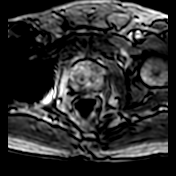
[im 617/1440]
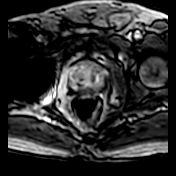
[im 658/1440]
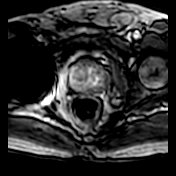
[im 699/1440]
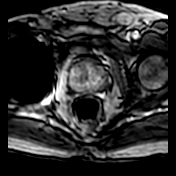
[im 741/1440]
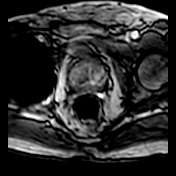
[im 782/1440]
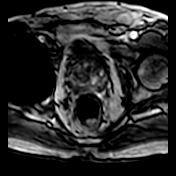
[im 823/1440]
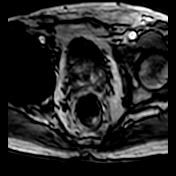
[im 864/1440]
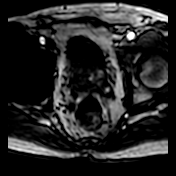
[im 905/1440]
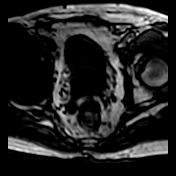
[im 946/1440]
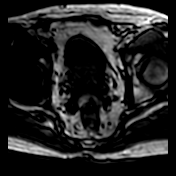
[im 987/1440]
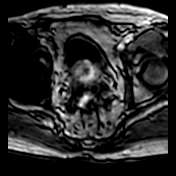
[im 1028/1440]
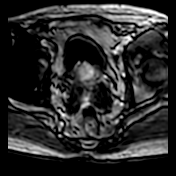
[im 1069/1440]
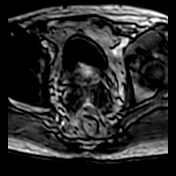
[im 1111/1440]
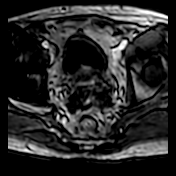
[im 1152/1440]
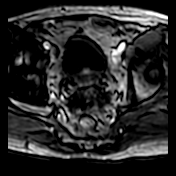
[im 1193/1440]
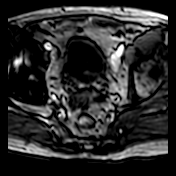
[im 1234/1440]
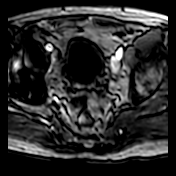
[im 1275/1440]
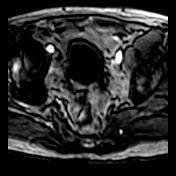
[im 1316/1440]
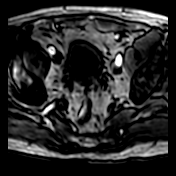
[im 1357/1440]
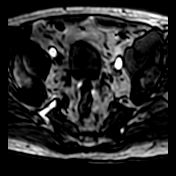
[im 1398/1440]
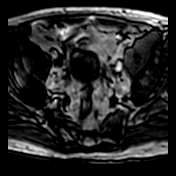
[im 1440/1440]
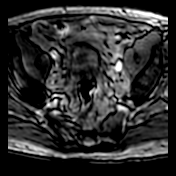

[Series 1001: (person_name)1/(person_name) · axial · 7.5mm · 0.58mm/px · 1 of 40 slices shown]
[im 1/40]
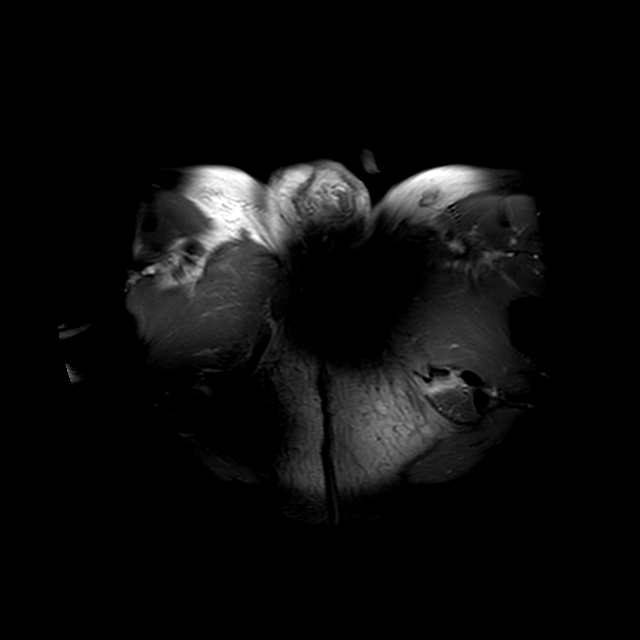

[48 of 48 positions shown; findings below may reference images not displayed]

FINDINGS: EXAM QUALITY: Mild artifact is present on diffusion sequences which is not a 
limiting abnormality. 
VOLUME: The prostate measures 5.7 x 5.3 x 5.2 cm with a volume of 80 cc. 
PERIPHERAL ZONE: 
There are 2 suspicious lesions in the peripheral zone. Lesion #1 is in the 
posterior central peripheral zone between mid gland and apex measuring 10 x 5 x 
12 mm and a volume of 0.34 cc showing restricted diffusion and mild enhancement. 
There is capsular contact without evidence for gross extracapsular extension. 
Lesion #2 is in the LEFT posterolateral peripheral zone just below mid gland 
level measuring 9 x 5 x 6 mm with a volume of 0.17 cc showing restricted 
diffusion without enhancement. There is no capsular contact for lesion #2. No 
other concerning peripheral zone lesions are detected. 
TRANSITIONAL ZONE: Nodular hyperplasia of the transition zone is present without 
significant median lobe hypertrophy. No suspicious transition zone lesions are 
detected. 
PROSTATE CAPSULE: Intact. 
MUSCLE SIDE WALLS: Normal. 
SEMINAL VESICLES: Normal. 
BLADDER: Unremarkable. 
RECTUM: Unremarkable. 
LYMPHADENOPATHY: No suspicious pelvic lymph nodes found. 
BONES: No suspicious skeletal lesions are found in the pelvic girdle. There is 
artifact from a RIGHT hip prosthesis. 
ADDITIONAL FINDINGS: No edema small fat-containing LEFT inguinal hernia noted.
IMPRESSION: There are 2 separate peripheral zone foci of likely malignancy as described 
above. As compared to October 30, 2018, both have shown mild interval 
enlargement. There is no evidence for extracapsular extension or suspicious 
pelvic lymph nodes. 
(PI-RADS 4): High (clinically significant cancer is likely to be present).

## 2023-01-02 IMAGING — DX ABDOMEN 1 VIEW
1 series · 2 of 2 positions shown · non-contrast
Comparison: Previous CT from [id number]

________________________________________________________________________________________________ 
ABDOMEN 1 VIEW, 01/02/2023 [DATE]: 
CLINICAL INDICATION: Calculus Of Kidney

[Series 1: kub · 0.14mm/px · 2 of 2 slices shown]
[im 1/2]
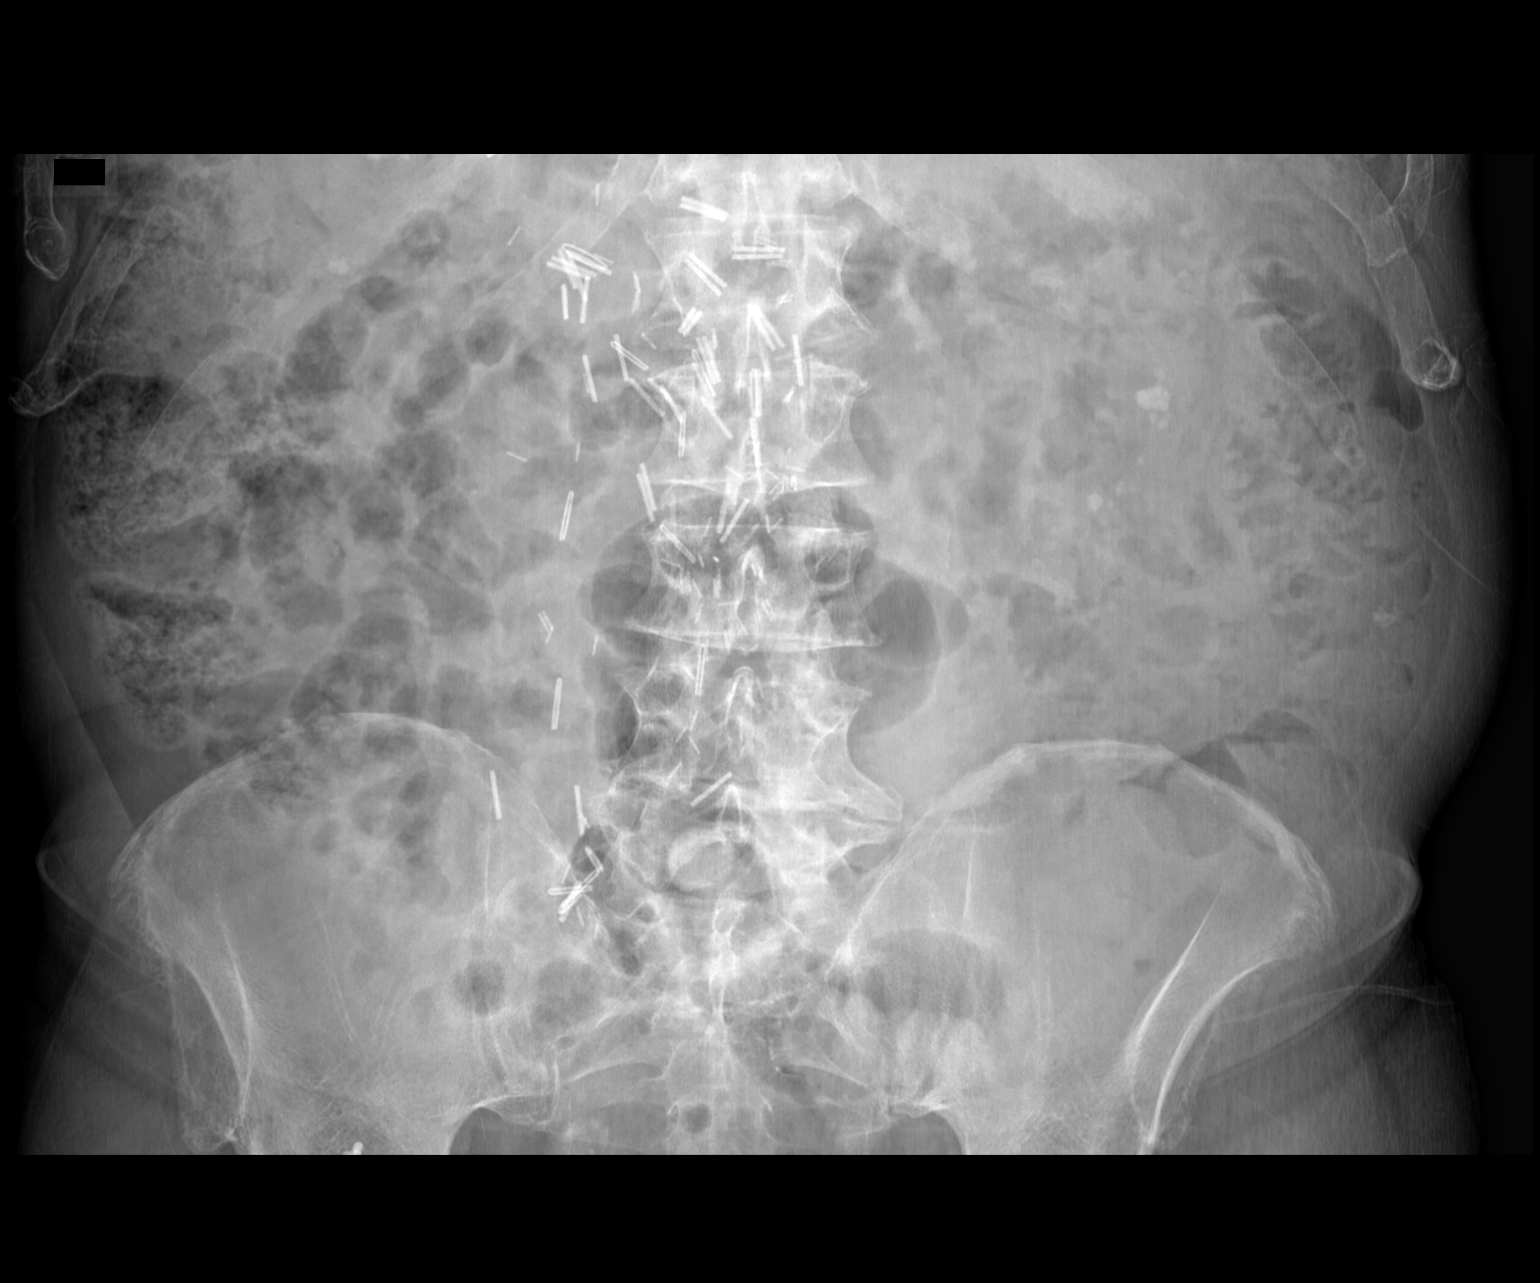
[im 2/2]
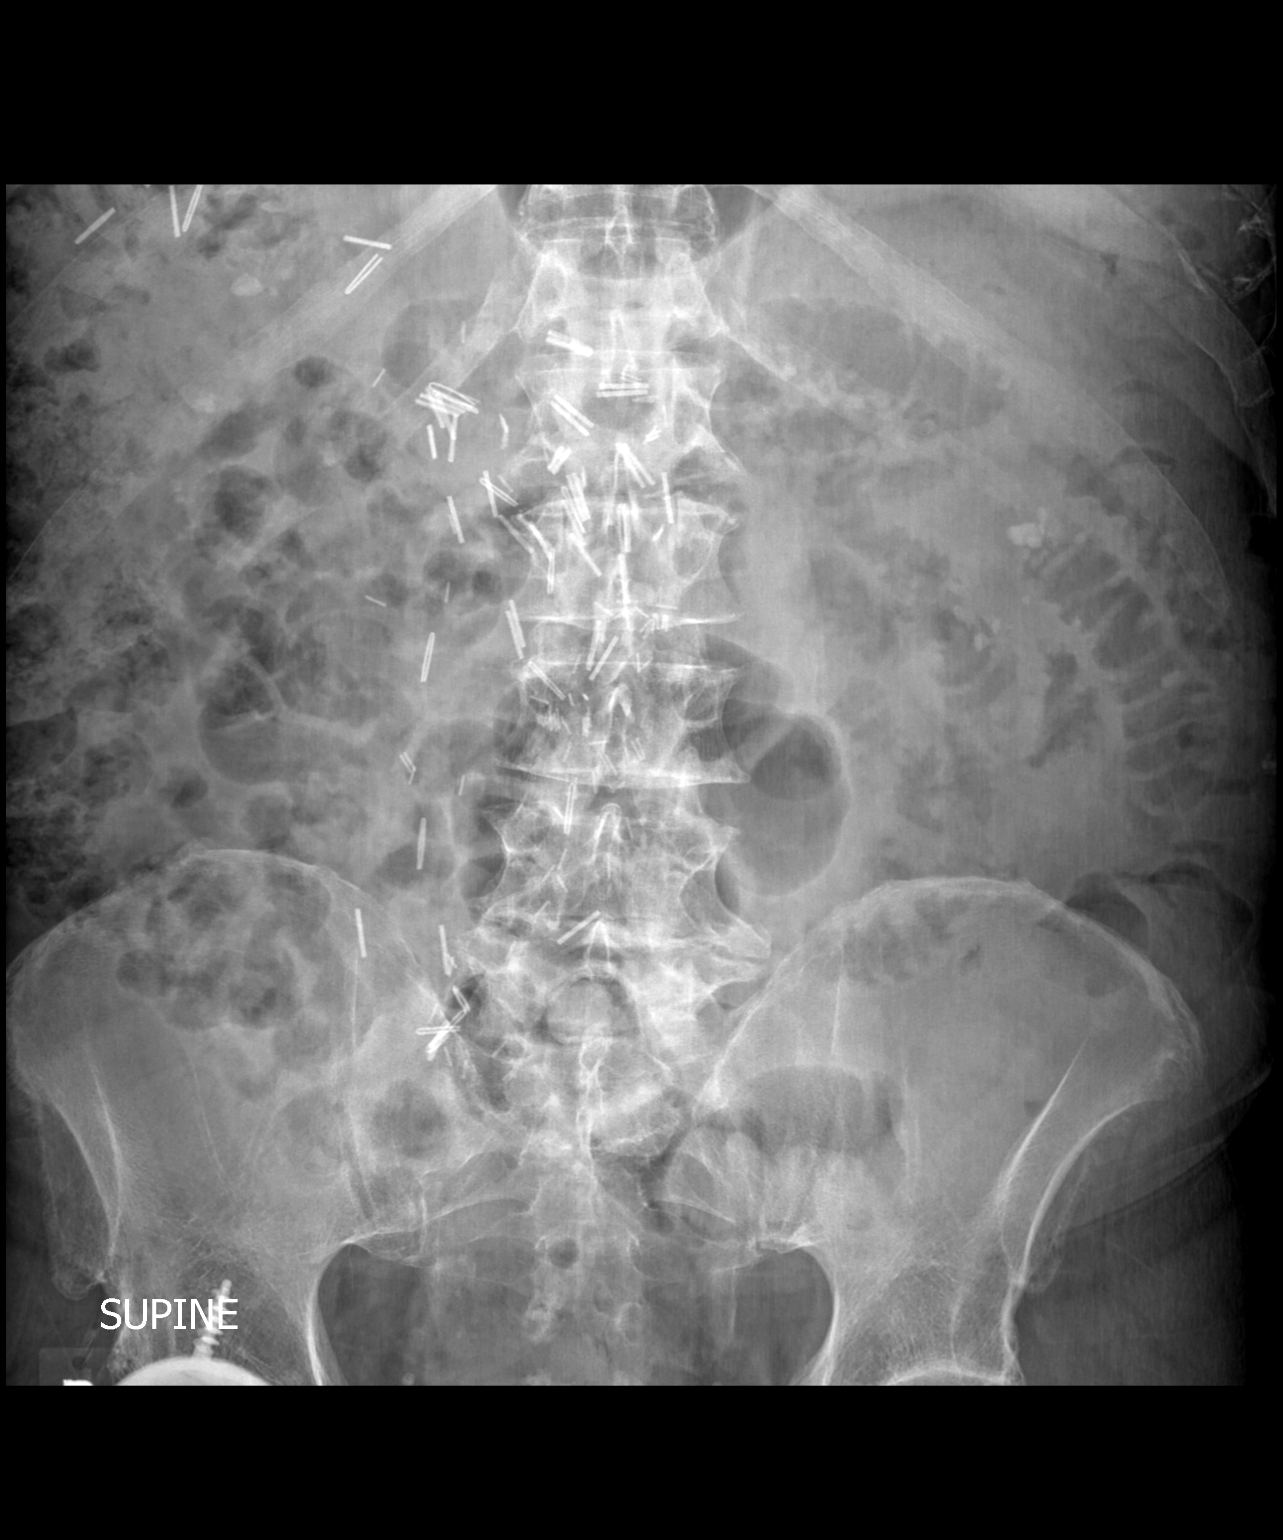

[2 of 2 positions shown; findings below may reference images not displayed]

FINDINGS: Postop changes seen overlying the abdomen/retroperitoneum and right 
hip. Degenerative changes. Numerous calculi are thought to be present overlying 
the kidneys bilaterally. Largest superiorly on the right at 9 mm and midportion 
of the left kidney at 9 mm. Greater than 5 calculi thought to be present 
overlying each kidney.
IMPRESSION: Numerous bilateral renal calculi.
